# Patient Record
Sex: Male | Born: 1975 | Race: White | Hispanic: No | Marital: Married | Smoking: Never smoker
Health system: Southern US, Community
[De-identification: ages and names within clinical notes are randomized; demographics above are authoritative.]

## PROBLEM LIST (undated history)

## (undated) DIAGNOSIS — I499 Cardiac arrhythmia, unspecified: Secondary | ICD-10-CM

## (undated) DIAGNOSIS — I514 Myocarditis, unspecified: Secondary | ICD-10-CM

## (undated) DIAGNOSIS — I10 Essential (primary) hypertension: Secondary | ICD-10-CM

## (undated) HISTORY — PX: EYE SURGERY: SHX253

---

## 2013-12-21 ENCOUNTER — Emergency Department (HOSPITAL_BASED_OUTPATIENT_CLINIC_OR_DEPARTMENT_OTHER)
Admission: EM | Admit: 2013-12-21 | Discharge: 2013-12-21 | Disposition: A | Discharge status: Home / Self Care | Payer: BC Managed Care – PPO | Attending: Emergency Medicine | Admitting: Emergency Medicine

## 2013-12-21 ENCOUNTER — Emergency Department (HOSPITAL_BASED_OUTPATIENT_CLINIC_OR_DEPARTMENT_OTHER): Payer: BC Managed Care – PPO

## 2013-12-21 ENCOUNTER — Encounter (HOSPITAL_BASED_OUTPATIENT_CLINIC_OR_DEPARTMENT_OTHER): Payer: Self-pay | Admitting: Emergency Medicine

## 2013-12-21 DIAGNOSIS — I499 Cardiac arrhythmia, unspecified: Secondary | ICD-10-CM | POA: Insufficient documentation

## 2013-12-21 DIAGNOSIS — M25569 Pain in unspecified knee: Secondary | ICD-10-CM | POA: Insufficient documentation

## 2013-12-21 DIAGNOSIS — M25561 Pain in right knee: Secondary | ICD-10-CM

## 2013-12-21 DIAGNOSIS — Z79899 Other long term (current) drug therapy: Secondary | ICD-10-CM | POA: Insufficient documentation

## 2013-12-21 DIAGNOSIS — I10 Essential (primary) hypertension: Secondary | ICD-10-CM | POA: Insufficient documentation

## 2013-12-21 DIAGNOSIS — Z880 Allergy status to penicillin: Secondary | ICD-10-CM | POA: Insufficient documentation

## 2013-12-21 DIAGNOSIS — M25469 Effusion, unspecified knee: Secondary | ICD-10-CM | POA: Insufficient documentation

## 2013-12-21 HISTORY — DX: Cardiac arrhythmia, unspecified: I49.9

## 2013-12-21 HISTORY — DX: Essential (primary) hypertension: I10

## 2013-12-21 HISTORY — DX: Myocarditis, unspecified: I51.4

## 2013-12-21 MED ORDER — NAPROXEN 500 MG PO TABS: 500.0000 mg | ORAL_TABLET | Freq: Two times a day (BID) | ORAL | Status: DC

## 2013-12-21 NOTE — ED Notes (Signed)
MD at bedside. 

## 2013-12-21 NOTE — ED Notes (Signed)
C/o pain to posterior right knee x 2 months-swelling to anterior knee x today-pt states he is on right knee at work "all the time"

## 2013-12-21 NOTE — ED Provider Notes (Signed)
CSN: [id number]     Arrival date & time 12/21/13  1951 History  This chart was scribed for Derek Jakes, MD by Blanchard Kelch, ED Scribe. The patient was seen in room MH11/MH11. Patient's care was started at 10:26 PM.     Chief Complaint  Patient presents with  . Leg Pain     Patient is a 38 y.o. male presenting with leg pain.  Leg Pain Location:  Knee Time since incident:  2 months Knee location:  R knee Pain details:    Quality: pulling.   Severity:  Mild   Duration:  2 months   Timing:  Constant   Progression:  Worsening Chronicity:  New Ineffective treatments:  None tried Associated symptoms: no back pain and no fever     HPI Comments: Derek Welch is a 38 y.o. male who presents to the Emergency Department complaining of constant posterior right knee pain that began two months ago but became constant two days ago. He has not been seen by an Orthopedist. He states that he has his right knee bent frequently at work and he feel pain after standing back up. He describes the pain as a pulling sensation and rates the pain as a 2-3/10 in severity. He reports associated swelling to the right knee for two days. He denies taking any medication for the pain. He denies prior injury to the knee requiring surgery. He denies fever, visual changes, cough, rhinorrhea, sore throat, chest pain, SOB, abdominal pain, nausea, vomiting, diarrhea, dysuria, leg swelling, rash, history of bleeding easily, feet numbness, neck pain or back pain.    Past Medical History  Diagnosis Date  . Hypertension   . Myocarditis   . Arrhythmia    Past Surgical History  Procedure Laterality Date  . Eye surgery     No family history on file. History  Substance Use Topics  . Smoking status: Never Smoker   . Smokeless tobacco: Not on file  . Alcohol Use: Yes    Review of Systems  Constitutional: Negative for fever and chills.  HENT: Negative for rhinorrhea and sore throat.   Eyes: Negative for visual  disturbance.  Respiratory: Negative for cough and shortness of breath.   Cardiovascular: Negative for chest pain and leg swelling.  Gastrointestinal: Negative for nausea, vomiting, abdominal pain and diarrhea.  Genitourinary: Negative for dysuria.  Musculoskeletal: Positive for arthralgias and joint swelling. Negative for back pain.  Skin: Negative for rash.  Neurological: Negative for numbness and headaches.  Hematological: Does not bruise/bleed easily.  Psychiatric/Behavioral: Negative for confusion.      Allergies  Penicillins  Home Medications   Current Outpatient Rx  Name  Route  Sig  Dispense  Refill  . LISINOPRIL PO   Oral   Take by mouth.         . METOPROLOL TARTRATE PO   Oral   Take by mouth.          Triage Vitals: BP 177/92  Pulse 89  Temp(Src) 98.3 F (36.8 C) (Oral)  Resp 20  Ht 5\' 9"  (1.753 m)  Wt 321 lb (145.605 kg)  BMI 47.38 kg/m2  SpO2 99%  Physical Exam  Nursing note and vitals reviewed. Constitutional: He is oriented to person, place, and time.  HENT:  Mouth/Throat: Mucous membranes are normal.  Eyes: EOM are normal.  Cardiovascular: Normal rate and regular rhythm.   No murmur heard. Pulmonary/Chest: Effort normal and breath sounds normal. No respiratory distress. He has no wheezes. He  has no rales.  Abdominal: Soft. Bowel sounds are normal. There is no tenderness.  Musculoskeletal: He exhibits no edema.  Patella is in right place, right knee. No effusion. No erythema. No palpable cord in calf. No joint line tenderness.   Neurological: He is alert and oriented to person, place, and time. No cranial nerve deficit. He exhibits normal muscle tone. Coordination normal.  Skin: Skin is warm and dry. No rash noted. No erythema.    ED Course  Procedures (including critical care time)  DIAGNOSTIC STUDIES: Oxygen Saturation is 99% on room air, normal by my interpretation.    COORDINATION OF CARE: 10:32 PM -Discussed radiology results with  the patient. Will give referral to Sports Medicine physician and prescription for Naprosyn.  Patient verbalizes understanding and agrees with treatment plan.  Medications - No data to display    Dg Knee 1-2 Views Right  12/21/2013   CLINICAL DATA:  Knee pain  EXAM: RIGHT KNEE - 1-2 VIEW  COMPARISON:  None.  FINDINGS: Two views of the right knee submitted. No acute fracture or subluxation. There is narrowing of lateral joint compartment. No joint effusion.  IMPRESSION: No acute fracture or subluxation. Narrowing of lateral joint compartment. No joint effusion.   Electronically Signed   By: Natasha MeadLiviu  Pop M.D.   On: 12/21/2013 20:57   [place] Venous Img Lower Unilateral Right  12/21/2013   CLINICAL DATA:  Pain and swelling  EXAM: RIGHT LOWER EXTREMITY VENOUS DUPLEX ULTRASOUND  TECHNIQUE: Gray-scale sonography with graded compression, as well as color Doppler and duplex ultrasound, were performed to evaluate the deep venous system from the level of the common femoral vein through the popliteal and proximal calf veins. Spectral Doppler was utilized to evaluate flow at rest and with distal augmentation maneuvers.  COMPARISON:  None.  FINDINGS: Flow in the venous structures of the right lower extremity is spontaneous and phasic in all segments. There is normal compression and augmentation in the venous structures of the right lower extremity. Venous Doppler signal is normal in all regions. There is no thrombus the deep or visualized superficial venous structures on the right. There is no right lower extremity deep venous incompetence.  IMPRESSION: No evidence of right lower extremity deep venous thrombosis.   Electronically Signed   By: Bretta BangWilliam  Woodruff M.D.   On: 12/21/2013 20:48     EKG Interpretation None      MDM   Final diagnoses:  Knee pain, right    Right-sided knee pain for approximately 2 months worse of late. Some posterior swelling a little bit of swelling to the front but no effusion. X-rays  negative. Doppler studies negative for DVT or Baker's cyst. No distinct injury. The patient may have some internal knee injury. Will treat with knee immobilizer and have her followup with sports medicine and anti-inflammatories.  I personally performed the services described in this documentation, which was scribed in my presence. The recorded information has been reviewed and is accurate.     Derek JakesScott W. Shamikia Linskey, MD 12/21/13 2245

## 2013-12-21 NOTE — Discharge Instructions (Signed)
Use knee immobilizer is much as possible [facility] per [facility] for your [facility]. Can remove for showering. Make an appointment with sports medicine upstairs call on Monday for time. Take the Naprosyn on a regular basis at least for the next 7-10 days. Return for any new or worse symptoms. As we discussed no evidence of a blood clot. No evidence of any bony abnormalities to the knee. However we are concerned about internal knee injury.

## 2013-12-31 ENCOUNTER — Encounter: Payer: Self-pay | Admitting: Family Medicine

## 2013-12-31 ENCOUNTER — Ambulatory Visit (INDEPENDENT_AMBULATORY_CARE_PROVIDER_SITE_OTHER): Payer: BC Managed Care – PPO | Admitting: Family Medicine

## 2013-12-31 VITALS — BP 137/86 | HR 84 | Ht 69.0 in | Wt 323.0 lb

## 2013-12-31 DIAGNOSIS — M25569 Pain in unspecified knee: Secondary | ICD-10-CM

## 2013-12-31 DIAGNOSIS — M25561 Pain in right knee: Secondary | ICD-10-CM

## 2013-12-31 NOTE — Patient Instructions (Signed)
Your history and exam are concerning for a meniscal tear. Start with conservative treatment. You were given a cortisone shot today. Ok to take aleve 2 tabs twice a day with food OR ibuprofen 600mg  three times a day with food for pain and inflammation. Elevate above the level of your heart as needed for swelling. Icing 15 minutes at a time 3-4 times a day as needed. Try to avoid deep squats, lunges, kneeling, crawling. Do home exercises: 3 sets of 10 of knee extensions, straight leg raises, half squats. Add weights if these become too easy. Follow up with me in 5-6 weeks. If not improving would consider MRI.

## 2014-01-07 ENCOUNTER — Encounter: Payer: Self-pay | Admitting: Family Medicine

## 2014-01-07 DIAGNOSIS — M25561 Pain in right knee: Secondary | ICD-10-CM | POA: Insufficient documentation
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
## 2014-01-07 NOTE — Assessment & Plan Note (Signed)
most likely due to meniscal tear given mechanism, today's testing, small effusion.  Discussed options - will start with intraarticular cortisone injection, nsaids, icing, relative rest, home exercise program.  Elevation, compression for swelling.  F/u in 5-6 weeks.  If not improving will consider MRI.  After informed written consent, patient was lying supine on exam table. Right knee was prepped with alcohol swab and utilizing superolateral approach, patient's right knee was injected intraarticularly with 3:1 marcaine: depomedrol. Patient tolerated the procedure well without immediate complications.

## 2014-01-10 ENCOUNTER — Other Ambulatory Visit: Payer: Self-pay | Admitting: *Deleted

## 2014-01-10 ENCOUNTER — Telehealth: Payer: Self-pay | Admitting: Family Medicine

## 2014-01-10 MED ORDER — NAPROXEN 500 MG PO TABS: 500.0000 mg | ORAL_TABLET | Freq: Two times a day (BID) | ORAL | Status: AC

## 2014-01-10 NOTE — Telephone Encounter (Signed)
Ok to call this in [place] with 1 refill.  Naproxen 500mg  twice a day, 60 tablets.  Thanks!

## 2014-02-13 ENCOUNTER — Other Ambulatory Visit: Payer: Self-pay | Admitting: *Deleted

## 2014-02-13 DIAGNOSIS — M25561 Pain in right knee: Secondary | ICD-10-CM

## 2014-02-15 ENCOUNTER — Telehealth: Payer: Self-pay | Admitting: *Deleted
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
## 2014-02-19 ENCOUNTER — Other Ambulatory Visit: Payer: Self-pay | Admitting: Family Medicine

## 2014-02-19 DIAGNOSIS — M25569 Pain in unspecified knee: Secondary | ICD-10-CM

## 2014-02-25 ENCOUNTER — Other Ambulatory Visit (HOSPITAL_COMMUNITY): Payer: BC Managed Care – PPO

## 2014-02-25 ENCOUNTER — Ambulatory Visit (HOSPITAL_COMMUNITY): Payer: BC Managed Care – PPO

## 2014-03-04 ENCOUNTER — Ambulatory Visit (HOSPITAL_COMMUNITY)
Admission: RE | Admit: 2014-03-04 | Discharge: 2014-03-04 | Disposition: A | Discharge status: Home / Self Care | Payer: BC Managed Care – PPO | Source: Ambulatory Visit | Attending: Family Medicine | Admitting: Family Medicine

## 2014-03-04 ENCOUNTER — Ambulatory Visit (HOSPITAL_COMMUNITY): Admission: RE | Admit: 2014-03-04 | Payer: BC Managed Care – PPO | Source: Ambulatory Visit

## 2014-03-04 DIAGNOSIS — M25569 Pain in unspecified knee: Secondary | ICD-10-CM

## 2014-03-11 ENCOUNTER — Ambulatory Visit (HOSPITAL_COMMUNITY): Payer: BC Managed Care – PPO | Attending: Family Medicine

## 2014-03-11 ENCOUNTER — Inpatient Hospital Stay (HOSPITAL_COMMUNITY): Admission: RE | Admit: 2014-03-11 | Payer: BC Managed Care – PPO | Source: Ambulatory Visit

## 2014-08-07 NOTE — Telephone Encounter (Signed)
Completed.

## 2014-10-11 IMAGING — US US EXTREM LOW VENOUS*R*
1 series · 14 of 24 positions shown · non-contrast
Comparison: None.

CLINICAL DATA: Pain and swelling

EXAM:
RIGHT LOWER EXTREMITY VENOUS DUPLEX ULTRASOUND
TECHNIQUE: Gray-scale sonography with graded compression, as well as color
Doppler and duplex ultrasound, were performed to evaluate the deep
venous system from the level of the common femoral vein through the
popliteal and proximal calf veins. Spectral Doppler was utilized to
evaluate flow at rest and with distal augmentation maneuvers.

[Series 1: us extrem low venous*right* · 14 of 36 slices shown]
[im 1/36]
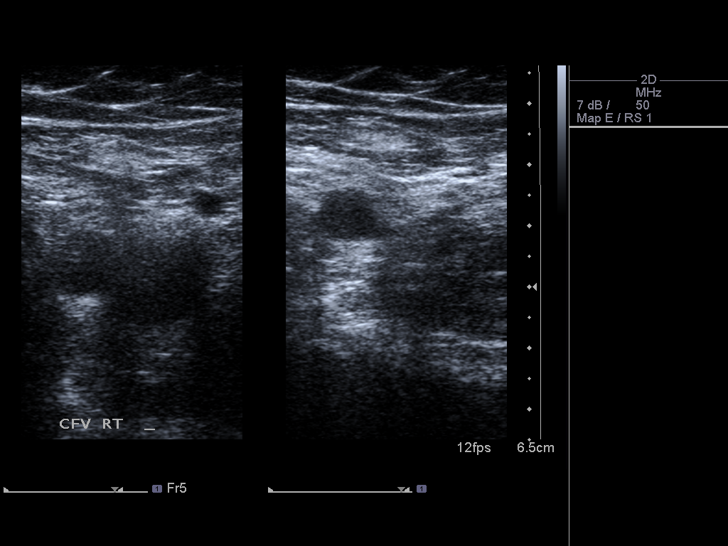
[im 4/36]
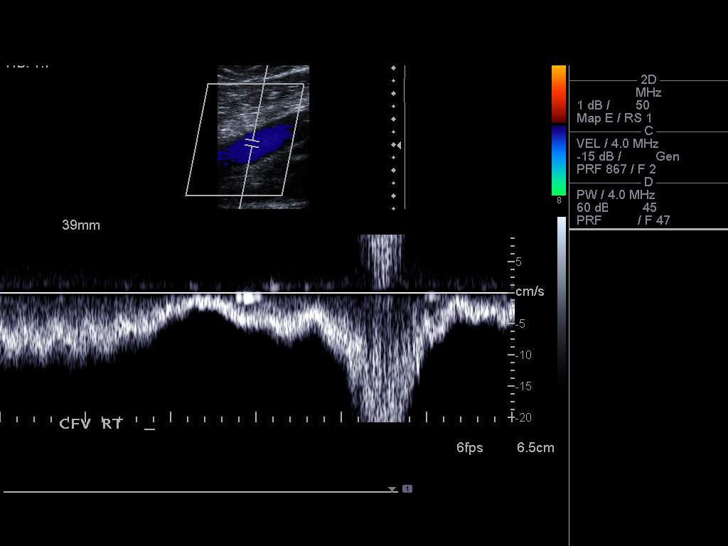
[im 7/36]
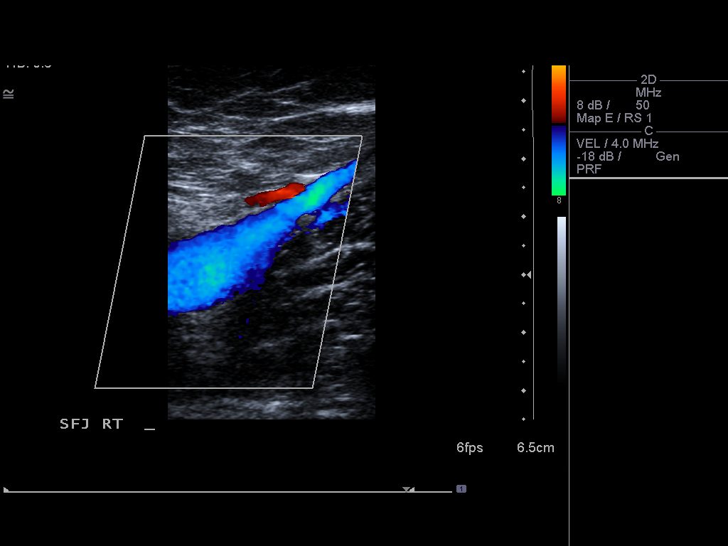
[im 10/36]
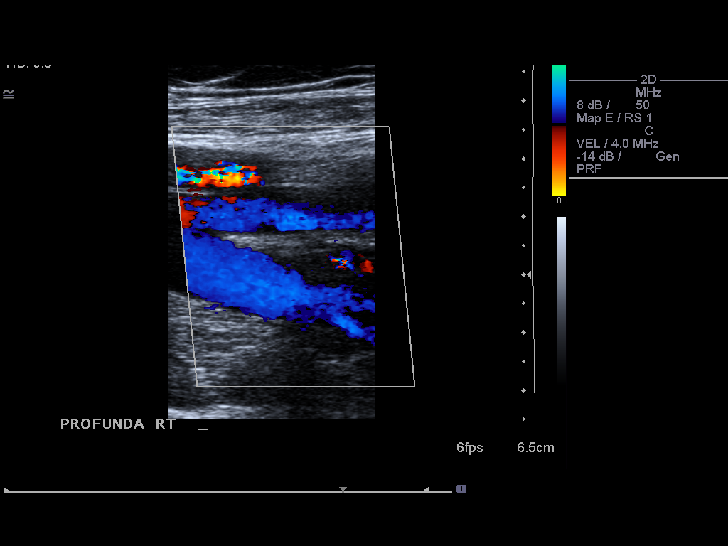
[im 11/36]
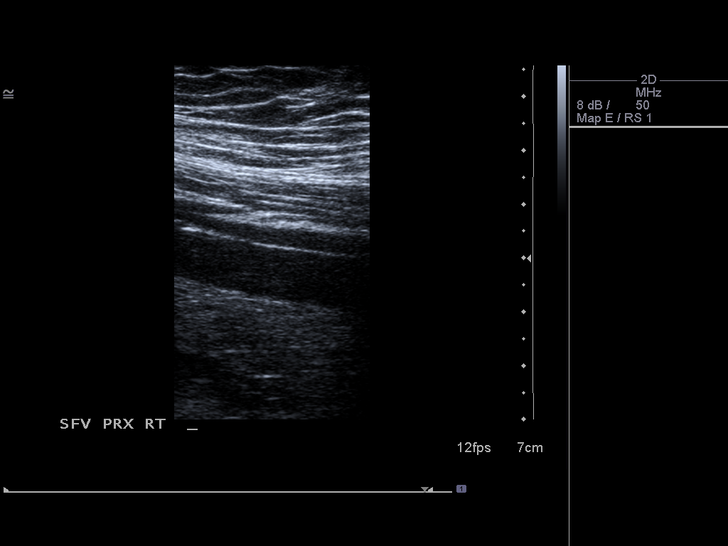
[im 14/36]
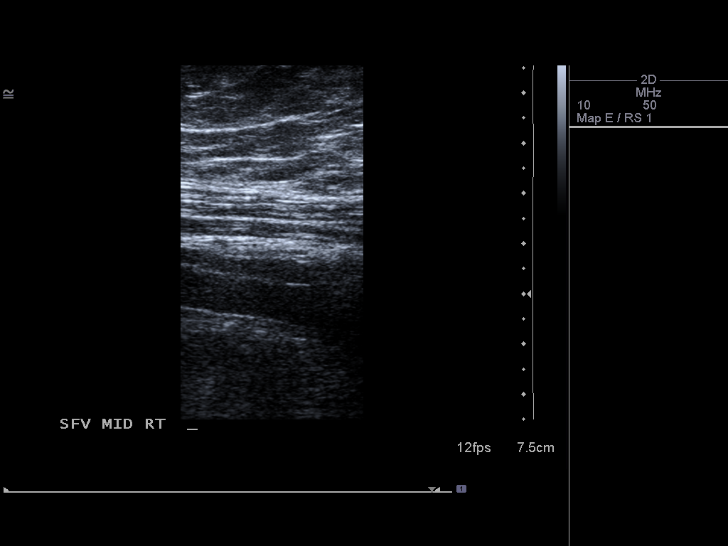
[im 17/36]
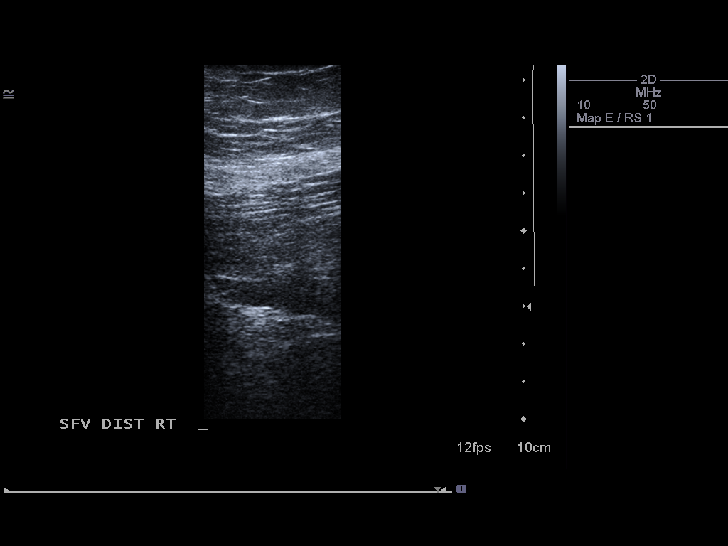
[im 19/36]
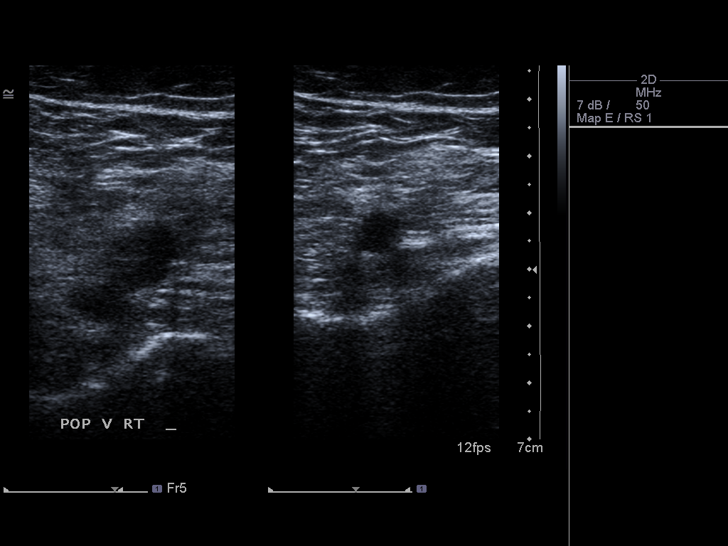
[im 22/36]
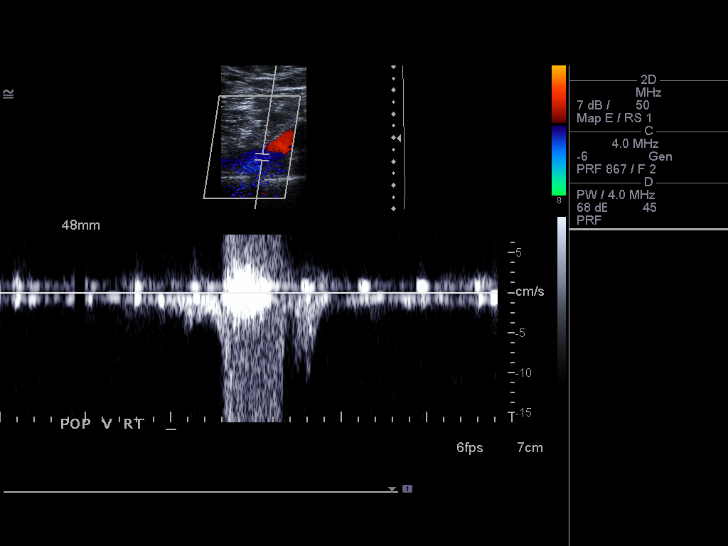
[im 25/36]
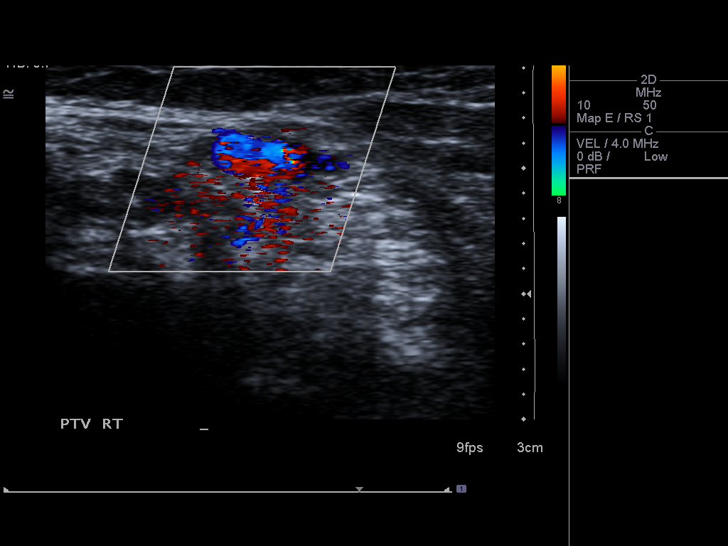
[im 28/36]
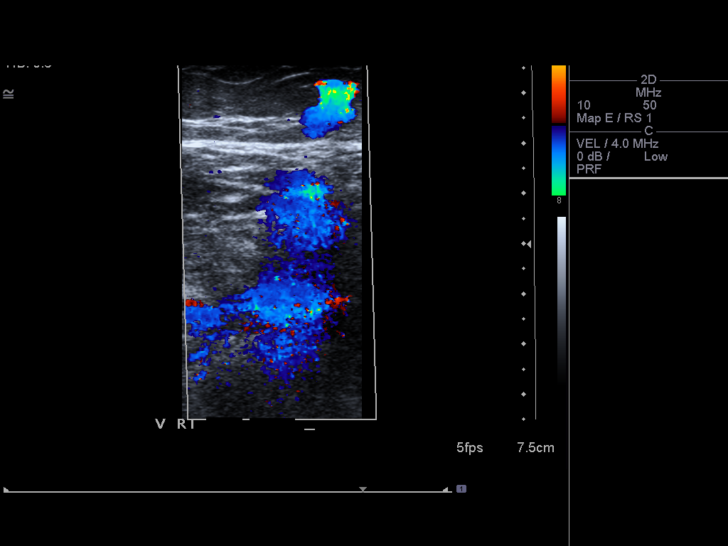
[im 29/36]
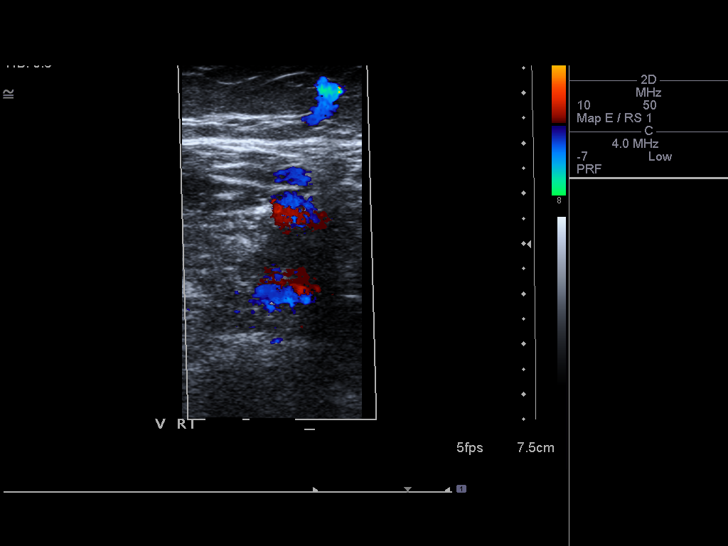
[im 32/36]
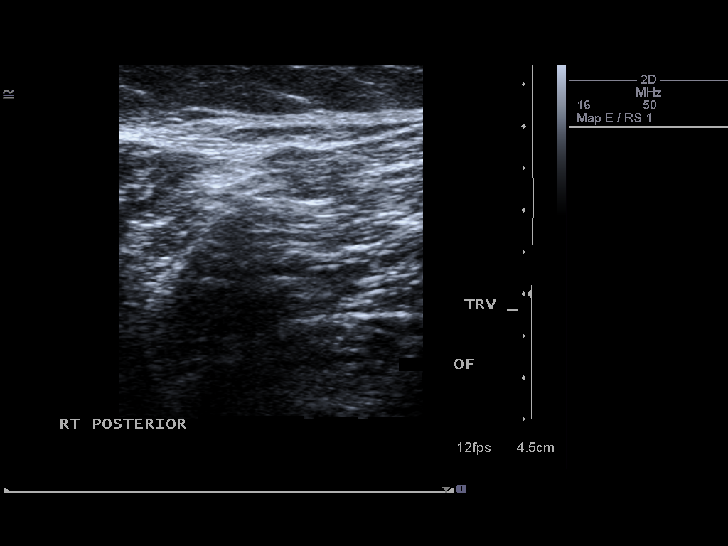
[im 36/36]
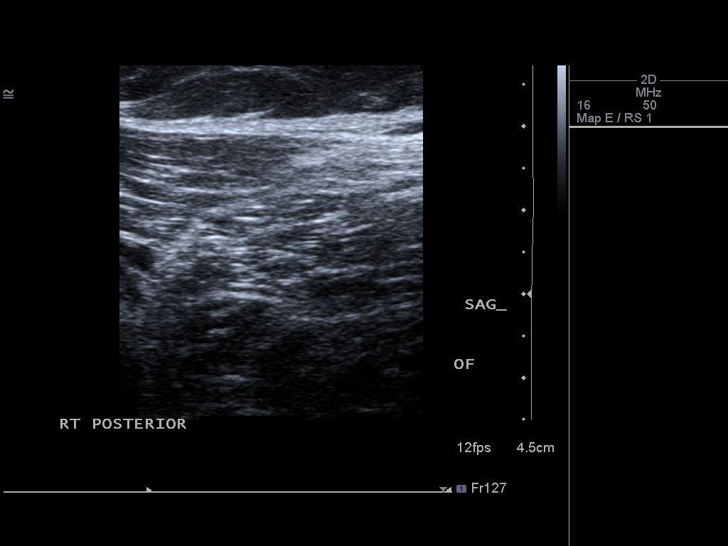

[14 of 24 positions shown; findings below may reference images not displayed]

FINDINGS: Flow in the venous structures of the right lower extremity is
spontaneous and phasic in all segments. There is normal compression
and augmentation in the venous structures of the right lower
extremity. Venous Doppler signal is normal in all regions. There is
no thrombus the deep or visualized superficial venous structures on
the right. There is no right lower extremity deep venous
incompetence.
IMPRESSION: No evidence of right lower extremity deep venous thrombosis.

## 2016-12-06 ENCOUNTER — Ambulatory Visit: Payer: Self-pay | Admitting: Family Medicine

## 2016-12-07 ENCOUNTER — Ambulatory Visit: Payer: Self-pay | Admitting: Family Medicine
